# Patient Record
Sex: Female | Born: 1975 | Race: White | Hispanic: No | Marital: Married | Smoking: Never smoker
Health system: Southern US, Community
[De-identification: ages and names within clinical notes are randomized; demographics above are authoritative.]

## PROBLEM LIST (undated history)

## (undated) DIAGNOSIS — R519 Headache, unspecified: Secondary | ICD-10-CM

## (undated) DIAGNOSIS — R51 Headache: Secondary | ICD-10-CM

## (undated) DIAGNOSIS — N979 Female infertility, unspecified: Secondary | ICD-10-CM

## (undated) DIAGNOSIS — Z87442 Personal history of urinary calculi: Secondary | ICD-10-CM

## (undated) DIAGNOSIS — N809 Endometriosis, unspecified: Secondary | ICD-10-CM

## (undated) DIAGNOSIS — J302 Other seasonal allergic rhinitis: Secondary | ICD-10-CM

## (undated) DIAGNOSIS — Z9889 Other specified postprocedural states: Secondary | ICD-10-CM

## (undated) DIAGNOSIS — D649 Anemia, unspecified: Secondary | ICD-10-CM

## (undated) DIAGNOSIS — R112 Nausea with vomiting, unspecified: Secondary | ICD-10-CM

## (undated) HISTORY — PX: WISDOM TOOTH EXTRACTION: SHX21

## (undated) HISTORY — PX: DIAGNOSTIC LAPAROSCOPY: SUR761

## (undated) HISTORY — PX: EYE SURGERY: SHX253

## (undated) HISTORY — PX: LAPAROTOMY: SHX154

---

## 1998-05-10 ENCOUNTER — Encounter: Admission: RE | Admit: 1998-05-10 | Discharge: 1998-08-08 | Payer: Self-pay

## 1999-06-16 ENCOUNTER — Encounter: Payer: Self-pay | Admitting: Emergency Medicine

## 1999-06-16 ENCOUNTER — Emergency Department (HOSPITAL_COMMUNITY): Admission: EM | Admit: 1999-06-16 | Discharge: 1999-06-16 | Payer: Self-pay | Admitting: Emergency Medicine

## 2000-04-16 ENCOUNTER — Ambulatory Visit (HOSPITAL_COMMUNITY): Admission: RE | Admit: 2000-04-16 | Discharge: 2000-04-16 | Payer: Self-pay | Admitting: *Deleted

## 2000-04-16 ENCOUNTER — Encounter: Payer: Self-pay | Admitting: *Deleted

## 2000-10-22 ENCOUNTER — Other Ambulatory Visit: Admission: RE | Admit: 2000-10-22 | Discharge: 2000-10-22 | Payer: Self-pay | Admitting: *Deleted

## 2000-12-16 ENCOUNTER — Observation Stay (HOSPITAL_COMMUNITY): Admission: RE | Admit: 2000-12-16 | Discharge: 2000-12-17 | Payer: Self-pay | Admitting: Obstetrics and Gynecology

## 2000-12-16 ENCOUNTER — Encounter (INDEPENDENT_AMBULATORY_CARE_PROVIDER_SITE_OTHER): Payer: Self-pay

## 2001-10-24 ENCOUNTER — Other Ambulatory Visit: Admission: RE | Admit: 2001-10-24 | Discharge: 2001-10-24 | Payer: Self-pay | Admitting: Obstetrics and Gynecology

## 2002-10-27 ENCOUNTER — Other Ambulatory Visit: Admission: RE | Admit: 2002-10-27 | Discharge: 2002-10-27 | Payer: Self-pay | Admitting: Obstetrics and Gynecology

## 2002-10-29 ENCOUNTER — Ambulatory Visit (HOSPITAL_COMMUNITY): Admission: RE | Admit: 2002-10-29 | Discharge: 2002-10-29 | Payer: Self-pay | Admitting: Obstetrics and Gynecology

## 2002-11-02 ENCOUNTER — Ambulatory Visit (HOSPITAL_BASED_OUTPATIENT_CLINIC_OR_DEPARTMENT_OTHER): Admission: RE | Admit: 2002-11-02 | Discharge: 2002-11-02 | Payer: Self-pay | Admitting: Obstetrics and Gynecology

## 2003-11-17 ENCOUNTER — Other Ambulatory Visit: Admission: RE | Admit: 2003-11-17 | Discharge: 2003-11-17 | Payer: Self-pay | Admitting: Obstetrics and Gynecology

## 2004-06-23 ENCOUNTER — Ambulatory Visit (HOSPITAL_COMMUNITY): Admission: RE | Admit: 2004-06-23 | Discharge: 2004-06-23 | Payer: Self-pay | Admitting: Obstetrics and Gynecology

## 2004-06-23 ENCOUNTER — Encounter (INDEPENDENT_AMBULATORY_CARE_PROVIDER_SITE_OTHER): Payer: Self-pay | Admitting: *Deleted

## 2005-04-18 ENCOUNTER — Ambulatory Visit (HOSPITAL_COMMUNITY): Admission: RE | Admit: 2005-04-18 | Discharge: 2005-04-18 | Payer: Self-pay | Admitting: Family Medicine

## 2005-04-18 ENCOUNTER — Emergency Department (HOSPITAL_COMMUNITY): Admission: EM | Admit: 2005-04-18 | Discharge: 2005-04-18 | Payer: Self-pay | Admitting: Family Medicine

## 2009-01-14 ENCOUNTER — Ambulatory Visit (HOSPITAL_COMMUNITY): Admission: RE | Admit: 2009-01-14 | Discharge: 2009-01-14 | Payer: Self-pay | Admitting: Obstetrics and Gynecology

## 2010-05-16 LAB — CBC
HCT: 37.5 % (ref 36.0–46.0)
Hemoglobin: 12.5 g/dL (ref 12.0–15.0)
MCHC: 33.3 g/dL (ref 30.0–36.0)
MCV: 95.7 fL (ref 78.0–100.0)
Platelets: 183 10*3/uL (ref 150–400)
RBC: 3.92 MIL/uL (ref 3.87–5.11)
RDW: 13.4 % (ref 11.5–15.5)
WBC: 3.9 10*3/uL — ABNORMAL LOW (ref 4.0–10.5)

## 2010-06-30 NOTE — Op Note (Signed)
Heather Sawyer, Heather Sawyer                        ACCOUNT NO.:  [id number]   MEDICAL RECORD NO.:  [id number]                   PATIENT TYPE:  AMB   LOCATION:  [facility]                                 FACILITY:  [facility]   PHYSICIAN:  Cynthia P. Romine, M.D.             DATE OF BIRTH:  March 31, 1975   DATE OF PROCEDURE:  11/02/2002  DATE OF DISCHARGE:                                 OPERATIVE REPORT   PREOPERATIVE DIAGNOSIS:  Primary infertility, rule out endometriosis.   POSTOPERATIVE DIAGNOSIS:  Endometriosis.   PROCEDURE:  Diagnostic laparoscopy, tubal lavage, and laser of endometriotic  implant.   SURGEON:  Cynthia P. Romine, M.D.   ANESTHESIA:  General endotracheal.   ESTIMATED BLOOD LOSS:  Minimal.   COMPLICATIONS:  None.   FINDINGS:  The right ovary was adherent to the distal end of the tube and to  the posterior aspect of the fundus in one small area. The anterior bladder  flap had an area of endometriosis approximately 0.5 cm in diameter on the  patient's right.  In the posterior cul-de-sac there was partial obliteration  and red endometriosis along both uterosacral ligaments.  The right ovary had  an approximately 1 cm chocolate endometriotic cyst and scattered powder burn  implants of endometriosis across the surface.  The tubes were patent to  tubal lavage with indigo carmine.  The appendix was normal.   DESCRIPTION OF PROCEDURE:  The patient was taken to the operating room and  after the induction of adequate general endotracheal anesthesia, was placed  in the dorsal lithotomy position and prepped and draped in the usual  fashion.  The cervix was grasped off the anterior lip with a single-tooth  tenaculum, and a Hulka uterine manipulator was placed.  Attention was next  turned to the abdomen.  A small incision was made vertically beneath the  umbilicus in the space of the umbilicus, and the Veress needle was inserted  into the peritoneal space.  Proper placement was tested  by noting free flow  of saline through the Veress needle with a negative aspirate and then by  noting the response of a drop of saline placed at the hub of the Veress  needle to negative pressure as the abdominal wall was elevated.  Pneumoperitoneum was created with 2.5 liters of CO2 using the automatic  insufflator.  A  disposable 10-11 mm trocar was then inserted into the  peritoneal space and its proper placement noted with the laparoscope.  Next,  a small suprapubic incision was made on the patient's right, and a 5 mm  trocar was inserted under direct visualization.  The pelvis was inspected  with the above findings.  The endometriotic implant on the posterior cul-de-  sac and on the left ovary were vaporized using the YAG laser at a power  setting of 15 watts.  Tubal lavage was then carried out with indigo carmine,  and free flow was  noted bilaterally.  The appendix was visualized and felt  to be normal.  There were some filmy adhesions of the large intestine to the  pelvic sidewall at the pelvic brim on the patient's right.  These did not  seem to interfere with the mobility of the tube or the ovary and were left  in place.  The procedure was then terminated.  The instruments were removed  from the abdomen.  The pneumoperitoneum was  allowed to escape.  The trocar sleeves were removed.  The incisions were  closed subcuticularly with 3-0 Vicryl Rapide.  The incisions were injected  with 0.5% Marcaine with epinephrine for postop pain relief.  Steri-Strips  and dressing were applied.  The instruments were removed from the vagina,  and the procedure was terminated.                                               Cynthia P. Romine, M.D.    CPR/MEDQ  D:  11/02/2002  T:  11/02/2002  Job:  [id number]

## 2010-06-30 NOTE — Op Note (Signed)
[facility]  Patient:    Heather Sawyer, Heather Sawyer Visit Number: [id number] MRN: [id number]          Service Type: GYN Location: 910A 9105 [id number] Attending Physician:  Brynda Peon Dictated by:   Edwena Felty. Ashley Royalty, M.D. Proc. Date: 12/16/00 Admit Date:  12/16/2000                             Operative Report  PREOPERATIVE DIAGNOSIS:       A 4 cm right ovarian dermoid cyst.  POSTOPERATIVE DIAGNOSIS:      A 4 cm right ovarian dermoid cyst, pathology pending.  PROCEDURE:                    Minilaparotomy with ovarian cystectomy on the right.  SURGEON:                      Cynthia P. Ashley Royalty, M.D.  ASSISTANT:                    Andres Ege, M.D.  ANESTHESIA:                   General endotracheal.  ESTIMATED BLOOD LOSS:         Minimal.  COMPLICATIONS:                None.  DESCRIPTION OF PROCEDURE:     The patient was taken to the operating room and, after the induction of adequate general endotracheal anesthesia, she was placed in the frog-leg position.  She was prepped in the usual fashion and an acorn uterine manipulator was placed.  The patient was then draped.  A small suprapubic incision was made approximately 6 cm over the midline.  The incision was carried down to the fascia using the Bovie.  The fascia was nicked and opened transversely with Mayo scissors.  Kochers were used to grasp the fascial margins.  It was separated from the underlying rectus muscles with sharp dissection.  The rectus muscles were separated bluntly in the midline. The underlying peritoneum was elevated between hemostats and entered atraumatically.  The peritoneum was opened vertically.  The uterine manipulator was used to elevate the uterus.  The right ovary was visualized. It was somewhat adherent to the posterior leaf of the broad ligament, but this adhesions could be taken down bluntly with the surgeons finger.  The ovary was elevated out of the  incision and surrounded with a wet lap.  An incision was made over the epithelium of the ovary and the ovarian tissue was dissected off the cyst using Metzenbaum scissors.  The cyst was removed intact.  It was opened after it was sent off to the table.  It did contain sebum and hair. The ovarian stroma was reapproximated with pursestring sutures of 2-0 chromic and the ovarian epithelium was closed with a baseball stitch using 4-0 Vicryl. Excellent hemostasis was noted.  The ovary was allowed to return to its anatomic position.  INTERCEED was placed over the ovarian incision.  The uterus otherwise appeared normal, as did the tube on the right side and tube and the ovary on the left side.  The peritoneum was then closed in a running fashion using 0 chromic.  The fascia was closed in a running fashion going from each end to the midline using 0 Vicryl.  Hemostasis was achieved in the subcu tissue  with the Bovie.  The skin was closed with 3-0 Vicryl Rapide.  The incision was infiltrated with 10 cc of 0.5% Marcaine with epinephrine for postoperative pain relief.  Benzoin and Steri-Strips was placed on the skin and a dressing was applied.  The acorn manipulator was removed from the vagina and the procedure was terminated.  The patient tolerated it well and went in satisfactory condition to postanesthesia recovery. Dictated by:   Edwena Felty. Ashley Royalty, M.D. Attending Physician:  Brynda Peon DD:  12/16/00 TD:  12/17/00 Job: [id number]

## 2010-06-30 NOTE — Op Note (Signed)
Heather Sawyer, Heather Sawyer              ACCOUNT NO.:  [id number]   MEDICAL RECORD NO.:  [id number]          PATIENT TYPE:  AMB   LOCATION:  [facility]                           FACILITY:  [facility]   PHYSICIAN:  Lenoard Aden, M.D.DATE OF BIRTH:  [birth year]   DATE OF PROCEDURE:  06/23/2004  DATE OF DISCHARGE:                                 OPERATIVE REPORT   PREOPERATIVE DIAGNOSIS:  Missed abortion for twin monoamniotic gestation at  10 weeks.   POSTOPERATIVE DIAGNOSIS:  Missed abortion for twin monoamniotic gestation at  10 weeks.   PROCEDURE:  Suction dilatation and evacuation under ultrasound guidance.   SURGEON:  Lenoard Aden, M.D.   ANESTHESIA:  MAC and paracervical.   ESTIMATED BLOOD LOSS:  Less than 50 mL.   COMPLICATIONS:  None.   DRAINS:  None.   COUNTS:  Correct.   Patient to recovery in good condition.   BRIEF OPERATIVE NOTE:  The patient was brought to the operating room after  being apprised of the risks and benefits of surgery, risks of anesthesia,  infection, bleeding, injury to abdominal organs and need for repair, delayed  versus immediate complications to include bowel and bladder injury are  noted.  At this time the patient was brought to the operating room.  Feet  are placed in the yellowfin stirrups in dorsal lithotomy position.  Exam  under anesthesia reveals a 10-week anteflexed uterus, no adnexal masses.  At  this time paracervical block with a 2% Xylocaine solution is placed without  difficulty.  The patient is prepped and draped in the usual sterile fashion,  catheterized until the bladder is empty.  The cervix is dilated with  difficulty due to acute anteflexion and sounds to 11 cm.  It is dilated with  difficulty up to a #21 Pratt dilator.  At this time suction curette reveals  minimal tissue and the thought is that due to the acute angulation of the  cavity there is inability to enter the cavity.  Therefore, ultrasound  guidance is requested.  The  cavity is then entered with acute angulation of  the uterine sound and easily entered.  A 7 mm suction curette is placed and  suction reveals products of conception in the tubing.  Repeat blunt  curettage in a four-quadrant  method, again suction reveals the cavity to be empty and is also confirmed  by ultrasound visualization of an otherwise normal uterine cavity.  Good  hemostasis is noted and minimal blood loss is appreciated.  The patient  tolerates the procedure well and is transferred to recovery in good  condition.      RJT/MEDQ  D:  06/23/2004  T:  06/23/2004  Job:  [id number]

## 2010-08-25 ENCOUNTER — Ambulatory Visit: Payer: Self-pay | Admitting: Internal Medicine

## 2012-11-27 ENCOUNTER — Emergency Department (INDEPENDENT_AMBULATORY_CARE_PROVIDER_SITE_OTHER)
Admission: EM | Admit: 2012-11-27 | Discharge: 2012-11-27 | Disposition: A | Discharge status: Home / Self Care | Payer: Self-pay | Source: Home / Self Care | Attending: Family Medicine | Admitting: Family Medicine

## 2012-11-27 ENCOUNTER — Encounter (HOSPITAL_COMMUNITY): Payer: Self-pay | Admitting: Emergency Medicine

## 2012-11-27 DIAGNOSIS — S139XXA Sprain of joints and ligaments of unspecified parts of neck, initial encounter: Secondary | ICD-10-CM

## 2012-11-27 DIAGNOSIS — S134XXA Sprain of ligaments of cervical spine, initial encounter: Secondary | ICD-10-CM

## 2012-11-27 MED ORDER — CYCLOBENZAPRINE HCL 10 MG PO TABS: 10.0000 mg | ORAL_TABLET | Freq: Two times a day (BID) | ORAL | Status: DC | PRN

## 2012-11-27 MED ORDER — TRAMADOL HCL 50 MG PO TABS: 50.0000 mg | ORAL_TABLET | Freq: Four times a day (QID) | ORAL | Status: DC | PRN
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
## 2012-12-02 NOTE — ED Provider Notes (Signed)
Medical screening examination/treatment/procedure(s) were performed by resident physician or non-physician practitioner and as supervising physician I was immediately available for consultation/collaboration.   KINDL,JAMES DOUGLAS MD.   James D Kindl, MD 12/02/12 2141 

## 2015-03-17 DIAGNOSIS — Z1151 Encounter for screening for human papillomavirus (HPV): Secondary | ICD-10-CM | POA: Diagnosis not present

## 2015-03-17 DIAGNOSIS — Z01419 Encounter for gynecological examination (general) (routine) without abnormal findings: Secondary | ICD-10-CM | POA: Diagnosis not present

## 2015-03-17 DIAGNOSIS — Z1231 Encounter for screening mammogram for malignant neoplasm of breast: Secondary | ICD-10-CM | POA: Diagnosis not present

## 2015-03-18 MED FILL — IBUPROFEN 800 MG TABLET: 800 | 20 days supply | Qty: 60 | Fill #0

## 2015-03-21 MED FILL — LIDOCAINE-HC 3-0.5% CRM KIT: 3-0.5 | 7 days supply | Qty: 98 | Fill #0

## 2015-04-07 DIAGNOSIS — E7800 Pure hypercholesterolemia, unspecified: Secondary | ICD-10-CM | POA: Diagnosis not present

## 2015-04-07 DIAGNOSIS — Z13220 Encounter for screening for lipoid disorders: Secondary | ICD-10-CM | POA: Diagnosis not present

## 2015-07-14 DIAGNOSIS — K648 Other hemorrhoids: Secondary | ICD-10-CM | POA: Diagnosis not present

## 2015-07-14 DIAGNOSIS — K644 Residual hemorrhoidal skin tags: Secondary | ICD-10-CM | POA: Diagnosis not present

## 2015-07-14 MED FILL — HYDROCORT-PRAMOXINE 2.5-1%: 2.5-1 | 10 days supply | Qty: 30 | Fill #0

## 2015-09-06 MED FILL — IBUPROFEN 800 MG TABLET: 800 | 20 days supply | Qty: 60 | Fill #1

## 2015-12-20 DIAGNOSIS — H5203 Hypermetropia, bilateral: Secondary | ICD-10-CM | POA: Diagnosis not present

## 2015-12-20 DIAGNOSIS — H524 Presbyopia: Secondary | ICD-10-CM | POA: Diagnosis not present

## 2016-02-13 MED FILL — IBUPROFEN 800 MG TABLET: 800 | 20 days supply | Qty: 60 | Fill #2

## 2016-04-11 DIAGNOSIS — Z1231 Encounter for screening mammogram for malignant neoplasm of breast: Secondary | ICD-10-CM | POA: Diagnosis not present

## 2016-04-11 DIAGNOSIS — Z6824 Body mass index (BMI) 24.0-24.9, adult: Secondary | ICD-10-CM | POA: Diagnosis not present

## 2016-04-11 DIAGNOSIS — Z01419 Encounter for gynecological examination (general) (routine) without abnormal findings: Secondary | ICD-10-CM | POA: Diagnosis not present

## 2016-04-17 MED FILL — CELECOXIB 200 MG CAP: 200 | 15 days supply | Qty: 30 | Fill #0

## 2016-06-08 DIAGNOSIS — N938 Other specified abnormal uterine and vaginal bleeding: Secondary | ICD-10-CM | POA: Diagnosis not present

## 2016-06-08 DIAGNOSIS — R1909 Other intra-abdominal and pelvic swelling, mass and lump: Secondary | ICD-10-CM | POA: Diagnosis not present

## 2016-06-08 DIAGNOSIS — R1031 Right lower quadrant pain: Secondary | ICD-10-CM | POA: Diagnosis not present

## 2016-09-03 ENCOUNTER — Other Ambulatory Visit: Payer: Self-pay | Admitting: Obstetrics and Gynecology

## 2016-09-03 NOTE — Patient Instructions (Addendum)
Your procedure is scheduled on:  Monday, August 6  Enter through the [employer] of [facility] at:  11 AM  Pick up the phone at the desk and dial [phone].  Call this number if you have problems the morning of surgery: [phone].  Remember: Do NOT eat food or drink clear liquids (including water) after Midnight Sunday  Take these medicines the morning of surgery with a SIP OF WATER:  zyrtec if needed  Stop herbal medications and supplements at this time.  Do NOT wear jewelry (body piercing), metal hair clips/bobby pins, make-up, or nail polish. Do NOT wear lotions, powders, or perfumes.  You may wear deoderant. Do NOT shave for 48 hours prior to surgery. Do NOT bring valuables to the hospital. Contacts may not be worn into surgery.  Leave suitcase in car.  After surgery it may be brought to your room.  For patients admitted to the hospital, checkout time is 11:00 AM the day of discharge. Have a responsible adult drive you home and stay with you for 24 hours after your procedure.  Home with husband Heather Sawyer cell [phone].

## 2016-09-05 ENCOUNTER — Encounter (HOSPITAL_COMMUNITY)
Admission: RE | Admit: 2016-09-05 | Discharge: 2016-09-05 | Disposition: A | Discharge status: Home / Self Care | Payer: 59 | Source: Ambulatory Visit | Attending: Obstetrics and Gynecology | Admitting: Obstetrics and Gynecology

## 2016-09-05 ENCOUNTER — Encounter (HOSPITAL_COMMUNITY): Payer: Self-pay

## 2016-09-05 DIAGNOSIS — Z01818 Encounter for other preprocedural examination: Secondary | ICD-10-CM | POA: Diagnosis not present

## 2016-09-05 HISTORY — DX: Headache: R51

## 2016-09-05 HISTORY — DX: Nausea with vomiting, unspecified: R11.2

## 2016-09-05 HISTORY — DX: Personal history of urinary calculi: Z87.442

## 2016-09-05 HISTORY — DX: Endometriosis, unspecified: N80.9

## 2016-09-05 HISTORY — DX: Female infertility, unspecified: N97.9

## 2016-09-05 HISTORY — DX: Anemia, unspecified: D64.9

## 2016-09-05 HISTORY — DX: Headache, unspecified: R51.9

## 2016-09-05 HISTORY — DX: Other seasonal allergic rhinitis: J30.2

## 2016-09-05 HISTORY — DX: Other specified postprocedural states: Z98.890

## 2016-09-05 LAB — BASIC METABOLIC PANEL
ANION GAP: 5 (ref 5–15)
BUN: 15 mg/dL (ref 6–20)
CALCIUM: 9.5 mg/dL (ref 8.9–10.3)
CO2: 26 mmol/L (ref 22–32)
Chloride: 104 mmol/L (ref 101–111)
Creatinine, Ser: 0.91 mg/dL (ref 0.44–1.00)
GFR calc Af Amer: >60 mL/min (ref >60)
GLUCOSE: 86 mg/dL (ref 65–99)
Potassium: 4.4 mmol/L (ref 3.5–5.1)
Sodium: 135 mmol/L (ref 135–145)

## 2016-09-05 LAB — CBC
HCT: 37.6 % (ref 36.0–46.0)
HEMOGLOBIN: 12.5 g/dL (ref 12.0–15.0)
MCH: 30.7 pg (ref 26.0–34.0)
MCHC: 33.2 g/dL (ref 30.0–36.0)
MCV: 92.4 fL (ref 78.0–100.0)
Platelets: 209 10*3/uL (ref 150–400)
RBC: 4.07 MIL/uL (ref 3.87–5.11)
RDW: 14.2 % (ref 11.5–15.5)
WBC: 5.8 10*3/uL (ref 4.0–10.5)

## 2016-09-05 LAB — TYPE AND SCREEN
ABO/RH(D): O POS
Antibody Screen: NEGATIVE

## 2016-09-05 LAB — ABO/RH: ABO/RH(D): O POS

## 2016-09-06 ENCOUNTER — Inpatient Hospital Stay (HOSPITAL_COMMUNITY): Admission: RE | Admit: 2016-09-06 | Payer: 59 | Source: Ambulatory Visit

## 2016-09-17 ENCOUNTER — Encounter (HOSPITAL_COMMUNITY): Payer: Self-pay | Admitting: *Deleted

## 2016-09-17 ENCOUNTER — Encounter (HOSPITAL_COMMUNITY)
Admission: RE | Disposition: A | Discharge status: Home / Self Care | Payer: Self-pay | Source: Ambulatory Visit | Attending: Obstetrics and Gynecology

## 2016-09-17 ENCOUNTER — Ambulatory Visit (HOSPITAL_COMMUNITY): Payer: 59 | Admitting: Anesthesiology

## 2016-09-17 ENCOUNTER — Ambulatory Visit (HOSPITAL_COMMUNITY)
Admission: RE | Admit: 2016-09-17 | Discharge: 2016-09-18 | Disposition: A | Discharge status: Home / Self Care | Payer: 59 | Source: Ambulatory Visit | Attending: Obstetrics and Gynecology | Admitting: Obstetrics and Gynecology

## 2016-09-17 DIAGNOSIS — N946 Dysmenorrhea, unspecified: Secondary | ICD-10-CM | POA: Insufficient documentation

## 2016-09-17 DIAGNOSIS — N920 Excessive and frequent menstruation with regular cycle: Secondary | ICD-10-CM | POA: Diagnosis not present

## 2016-09-17 DIAGNOSIS — N840 Polyp of corpus uteri: Secondary | ICD-10-CM | POA: Insufficient documentation

## 2016-09-17 DIAGNOSIS — N838 Other noninflammatory disorders of ovary, fallopian tube and broad ligament: Secondary | ICD-10-CM | POA: Diagnosis not present

## 2016-09-17 DIAGNOSIS — N802 Endometriosis of fallopian tube: Secondary | ICD-10-CM | POA: Insufficient documentation

## 2016-09-17 DIAGNOSIS — N8311 Corpus luteum cyst of right ovary: Secondary | ICD-10-CM | POA: Insufficient documentation

## 2016-09-17 DIAGNOSIS — N736 Female pelvic peritoneal adhesions (postinfective): Secondary | ICD-10-CM | POA: Diagnosis not present

## 2016-09-17 DIAGNOSIS — N803 Endometriosis of pelvic peritoneum: Secondary | ICD-10-CM | POA: Insufficient documentation

## 2016-09-17 DIAGNOSIS — D259 Leiomyoma of uterus, unspecified: Secondary | ICD-10-CM | POA: Insufficient documentation

## 2016-09-17 DIAGNOSIS — R102 Pelvic and perineal pain: Secondary | ICD-10-CM | POA: Diagnosis not present

## 2016-09-17 DIAGNOSIS — N809 Endometriosis, unspecified: Secondary | ICD-10-CM | POA: Diagnosis present

## 2016-09-17 HISTORY — PX: ROBOTIC ASSISTED TOTAL HYSTERECTOMY WITH SALPINGECTOMY: SHX6679

## 2016-09-17 SURGERY — ROBOTIC ASSISTED TOTAL HYSTERECTOMY WITH SALPINGECTOMY
Anesthesia: General | Laterality: Bilateral

## 2016-09-17 MED ORDER — OXYCODONE-ACETAMINOPHEN 5-325 MG PO TABS: 1.0000 | ORAL_TABLET | ORAL | Status: DC | PRN

## 2016-09-17 MED ORDER — OXYCODONE HCL 5 MG PO TABS: 5.0000 mg | ORAL_TABLET | Freq: Once | ORAL | Status: DC | PRN

## 2016-09-17 MED ORDER — ONDANSETRON HCL 4 MG/2ML IJ SOLN
4.0000 mg | Freq: Once | INTRAMUSCULAR | Status: AC
  Administered 2016-09-17: 4 mg via INTRAVENOUS

## 2016-09-17 MED ORDER — LIDOCAINE HCL (CARDIAC) 20 MG/ML IV SOLN
INTRAVENOUS | Status: AC
  Filled 2016-09-17: qty 5

## 2016-09-17 MED ORDER — HYDROMORPHONE HCL 1 MG/ML IJ SOLN
0.2500 mg | INTRAMUSCULAR | Status: DC | PRN
  Administered 2016-09-17 (×2): 0.25 mg via INTRAVENOUS

## 2016-09-17 MED ORDER — FENTANYL CITRATE (PF) 100 MCG/2ML IJ SOLN
INTRAMUSCULAR | Status: AC
  Administered 2016-09-17: 50 ug via INTRAVENOUS
  Filled 2016-09-17: qty 2

## 2016-09-17 MED ORDER — LACTATED RINGERS IV SOLN
INTRAVENOUS | Status: DC
  Administered 2016-09-17 (×3): via INTRAVENOUS

## 2016-09-17 MED ORDER — FENTANYL CITRATE (PF) 100 MCG/2ML IJ SOLN
50.0000 ug | Freq: Once | INTRAMUSCULAR | Status: AC
  Administered 2016-09-17: 50 ug via INTRAVENOUS

## 2016-09-17 MED ORDER — ROPIVACAINE HCL 5 MG/ML IJ SOLN
INTRAMUSCULAR | Status: AC
  Filled 2016-09-17: qty 60

## 2016-09-17 MED ORDER — FENTANYL CITRATE (PF) 250 MCG/5ML IJ SOLN
INTRAMUSCULAR | Status: AC
  Filled 2016-09-17: qty 5

## 2016-09-17 MED ORDER — DEXAMETHASONE SODIUM PHOSPHATE 4 MG/ML IJ SOLN
INTRAMUSCULAR | Status: AC
Start: 2016-09-17 — End: ?
  Filled 2016-09-17: qty 1

## 2016-09-17 MED ORDER — LIDOCAINE HCL (CARDIAC) 20 MG/ML IV SOLN
INTRAVENOUS | Status: DC | PRN
  Administered 2016-09-17: 60 mg via INTRAVENOUS

## 2016-09-17 MED ORDER — SODIUM CHLORIDE 0.9 % IJ SOLN
INTRAMUSCULAR | Status: AC
  Filled 2016-09-17: qty 50

## 2016-09-17 MED ORDER — FENTANYL CITRATE (PF) 100 MCG/2ML IJ SOLN
INTRAMUSCULAR | Status: DC | PRN
  Administered 2016-09-17: 100 ug via INTRAVENOUS
  Administered 2016-09-17: 150 ug via INTRAVENOUS

## 2016-09-17 MED ORDER — KETOROLAC TROMETHAMINE 30 MG/ML IJ SOLN
INTRAMUSCULAR | Status: AC
  Filled 2016-09-17: qty 1

## 2016-09-17 MED ORDER — ONDANSETRON HCL 4 MG/2ML IJ SOLN
INTRAMUSCULAR | Status: AC
  Administered 2016-09-17: 4 mg via INTRAVENOUS
  Filled 2016-09-17: qty 2

## 2016-09-17 MED ORDER — TRAMADOL HCL 50 MG PO TABS: 50.0000 mg | ORAL_TABLET | Freq: Four times a day (QID) | ORAL | Status: DC | PRN

## 2016-09-17 MED ORDER — HYDROMORPHONE HCL 1 MG/ML IJ SOLN
INTRAMUSCULAR | Status: AC
  Administered 2016-09-17: 0.25 mg via INTRAVENOUS
  Filled 2016-09-17: qty 0.5

## 2016-09-17 MED ORDER — HYDROMORPHONE HCL 1 MG/ML IJ SOLN
INTRAMUSCULAR | Status: DC | PRN
  Administered 2016-09-17: 1 mg via INTRAVENOUS

## 2016-09-17 MED ORDER — ONDANSETRON HCL 4 MG/2ML IJ SOLN
INTRAMUSCULAR | Status: AC
  Filled 2016-09-17: qty 2

## 2016-09-17 MED ORDER — SODIUM CHLORIDE 0.9% FLUSH: 9.0000 mL | INTRAVENOUS | Status: DC | PRN

## 2016-09-17 MED ORDER — KETOROLAC TROMETHAMINE 30 MG/ML IJ SOLN
INTRAMUSCULAR | Status: DC | PRN
  Administered 2016-09-17: 30 mg via INTRAVENOUS

## 2016-09-17 MED ORDER — SUGAMMADEX SODIUM 200 MG/2ML IV SOLN
INTRAVENOUS | Status: DC | PRN
  Administered 2016-09-17: 200 mg via INTRAVENOUS

## 2016-09-17 MED ORDER — ONDANSETRON HCL 4 MG/2ML IJ SOLN
4.0000 mg | Freq: Four times a day (QID) | INTRAMUSCULAR | Status: DC | PRN
Start: 2016-09-17 — End: 2016-09-18
  Administered 2016-09-18: 4 mg via INTRAVENOUS
  Filled 2016-09-17: qty 2

## 2016-09-17 MED ORDER — BUPIVACAINE HCL (PF) 0.25 % IJ SOLN
INTRAMUSCULAR | Status: AC
  Filled 2016-09-17: qty 30

## 2016-09-17 MED ORDER — MIDAZOLAM HCL 2 MG/2ML IJ SOLN
INTRAMUSCULAR | Status: DC | PRN
  Administered 2016-09-17: 2 mg via INTRAVENOUS

## 2016-09-17 MED ORDER — HYDROMORPHONE 1 MG/ML IV SOLN
INTRAVENOUS | Status: DC
  Administered 2016-09-17: 0.3 mg via INTRAVENOUS
  Administered 2016-09-17: 20:00:00 via INTRAVENOUS
  Administered 2016-09-18: 0.4 mg via INTRAVENOUS
  Administered 2016-09-18: 0.2 mg via INTRAVENOUS
  Filled 2016-09-17: qty 25

## 2016-09-17 MED ORDER — OXYCODONE HCL 5 MG/5ML PO SOLN: 5.0000 mg | Freq: Once | ORAL | Status: DC | PRN

## 2016-09-17 MED ORDER — DIPHENHYDRAMINE HCL 50 MG/ML IJ SOLN: 12.5000 mg | Freq: Four times a day (QID) | INTRAMUSCULAR | Status: DC | PRN

## 2016-09-17 MED ORDER — SCOPOLAMINE 1 MG/3DAYS TD PT72
MEDICATED_PATCH | TRANSDERMAL | Status: AC
  Administered 2016-09-17: 1.5 mg via TRANSDERMAL
  Filled 2016-09-17: qty 1

## 2016-09-17 MED ORDER — DEXTROSE IN LACTATED RINGERS 5 % IV SOLN
INTRAVENOUS | Status: DC
  Administered 2016-09-17 – 2016-09-18 (×2): via INTRAVENOUS

## 2016-09-17 MED ORDER — ONDANSETRON HCL 4 MG/2ML IJ SOLN
INTRAMUSCULAR | Status: DC | PRN
  Administered 2016-09-17: 4 mg via INTRAVENOUS

## 2016-09-17 MED ORDER — MEPERIDINE HCL 25 MG/ML IJ SOLN: 6.2500 mg | INTRAMUSCULAR | Status: DC | PRN

## 2016-09-17 MED ORDER — SODIUM CHLORIDE 0.9 % IJ SOLN
INTRAMUSCULAR | Status: DC | PRN
  Administered 2016-09-17: 10 mL

## 2016-09-17 MED ORDER — SCOPOLAMINE 1 MG/3DAYS TD PT72
1.0000 | MEDICATED_PATCH | Freq: Once | TRANSDERMAL | Status: DC
  Administered 2016-09-17: 1.5 mg via TRANSDERMAL

## 2016-09-17 MED ORDER — PROPOFOL 10 MG/ML IV BOLUS
INTRAVENOUS | Status: DC | PRN
  Administered 2016-09-17: 140 mg via INTRAVENOUS

## 2016-09-17 MED ORDER — PROMETHAZINE HCL 25 MG/ML IJ SOLN: 6.2500 mg | INTRAMUSCULAR | Status: DC | PRN

## 2016-09-17 MED ORDER — ROCURONIUM BROMIDE 100 MG/10ML IV SOLN
INTRAVENOUS | Status: AC
  Filled 2016-09-17: qty 1

## 2016-09-17 MED ORDER — BUPIVACAINE HCL (PF) 0.25 % IJ SOLN
INTRAMUSCULAR | Status: DC | PRN
  Administered 2016-09-17: 30 mL

## 2016-09-17 MED ORDER — NALOXONE HCL 0.4 MG/ML IJ SOLN: 0.4000 mg | INTRAMUSCULAR | Status: DC | PRN

## 2016-09-17 MED ORDER — MIDAZOLAM HCL 2 MG/2ML IJ SOLN
INTRAMUSCULAR | Status: AC
  Filled 2016-09-17: qty 2

## 2016-09-17 MED ORDER — PROPOFOL 10 MG/ML IV BOLUS
INTRAVENOUS | Status: AC
  Filled 2016-09-17: qty 20

## 2016-09-17 MED ORDER — CEFAZOLIN SODIUM-DEXTROSE 2-4 GM/100ML-% IV SOLN
2.0000 g | INTRAVENOUS | Status: AC
  Administered 2016-09-17: 2 g via INTRAVENOUS

## 2016-09-17 MED ORDER — DIPHENHYDRAMINE HCL 12.5 MG/5ML PO ELIX: 12.5000 mg | ORAL_SOLUTION | Freq: Four times a day (QID) | ORAL | Status: DC | PRN

## 2016-09-17 MED ORDER — ROCURONIUM BROMIDE 100 MG/10ML IV SOLN
INTRAVENOUS | Status: DC | PRN
  Administered 2016-09-17: 50 mg via INTRAVENOUS
  Administered 2016-09-17: 20 mg via INTRAVENOUS
  Administered 2016-09-17: 10 mg via INTRAVENOUS

## 2016-09-17 MED ORDER — HYDROMORPHONE HCL 1 MG/ML IJ SOLN
INTRAMUSCULAR | Status: AC
  Filled 2016-09-17: qty 1

## 2016-09-17 SURGICAL SUPPLY — 55 items
ADH SKN CLS APL DERMABOND .7 (GAUZE/BANDAGES/DRESSINGS) ×1
BARRIER ADHS 3X4 INTERCEED (GAUZE/BANDAGES/DRESSINGS) IMPLANT
BRR ADH 4X3 ABS CNTRL BYND (GAUZE/BANDAGES/DRESSINGS)
CATH FOLEY 3WAY  5CC 16FR (CATHETERS) ×1
CATH FOLEY 3WAY 5CC 16FR (CATHETERS) ×1 IMPLANT
CLOTH BEACON ORANGE TIMEOUT ST (SAFETY) ×2 IMPLANT
CONT PATH 16OZ SNAP LID 3702 (MISCELLANEOUS) ×2 IMPLANT
COVER BACK TABLE 60X90IN (DRAPES) ×4 IMPLANT
COVER TIP SHEARS 8 DVNC (MISCELLANEOUS) ×1 IMPLANT
COVER TIP SHEARS 8MM DA VINCI (MISCELLANEOUS) ×1
DECANTER SPIKE VIAL GLASS SM (MISCELLANEOUS) ×6 IMPLANT
DERMABOND ADVANCED (GAUZE/BANDAGES/DRESSINGS) ×1
DERMABOND ADVANCED .7 DNX12 (GAUZE/BANDAGES/DRESSINGS) ×1 IMPLANT
DURAPREP 26ML APPLICATOR (WOUND CARE) ×2 IMPLANT
ELECT REM PT RETURN 9FT ADLT (ELECTROSURGICAL) ×2
ELECTRODE REM PT RTRN 9FT ADLT (ELECTROSURGICAL) ×1 IMPLANT
GAUZE VASELINE 3X9 (GAUZE/BANDAGES/DRESSINGS) IMPLANT
GLOVE BIO SURGEON STRL SZ7.5 (GLOVE) ×6 IMPLANT
GLOVE BIOGEL PI IND STRL 7.0 (GLOVE) ×2 IMPLANT
GLOVE BIOGEL PI INDICATOR 7.0 (GLOVE) ×2
KIT ACCESSORY DA VINCI DISP (KITS) ×1
KIT ACCESSORY DVNC DISP (KITS) ×1 IMPLANT
LEGGING LITHOTOMY PAIR STRL (DRAPES) ×2 IMPLANT
NEEDLE INSUFFLATION 150MM (ENDOMECHANICALS) ×2 IMPLANT
OCCLUDER COLPOPNEUMO (BALLOONS) ×2 IMPLANT
PACK ROBOT WH (CUSTOM PROCEDURE TRAY) ×2 IMPLANT
PACK ROBOTIC GOWN (GOWN DISPOSABLE) ×2 IMPLANT
PACK TRENDGUARD 450 HYBRID PRO (MISCELLANEOUS) IMPLANT
PACK TRENDGUARD 600 HYBRD PROC (MISCELLANEOUS) IMPLANT
PAD PREP 24X48 CUFFED NSTRL (MISCELLANEOUS) ×2 IMPLANT
POUCH LAPAROSCOPIC INSTRUMENT (MISCELLANEOUS) IMPLANT
PROTECTOR NERVE ULNAR (MISCELLANEOUS) ×4 IMPLANT
SET CYSTO W/LG BORE CLAMP LF (SET/KITS/TRAYS/PACK) IMPLANT
SET IRRIG TUBING LAPAROSCOPIC (IRRIGATION / IRRIGATOR) ×2 IMPLANT
SET TRI-LUMEN FLTR TB AIRSEAL (TUBING) ×2 IMPLANT
SUT VIC AB 0 CT1 27 (SUTURE) ×4
SUT VIC AB 0 CT1 27XBRD ANBCTR (SUTURE) ×2 IMPLANT
SUT VICRYL 0 UR6 27IN ABS (SUTURE) ×2 IMPLANT
SUT VICRYL RAPIDE 4/0 PS 2 (SUTURE) ×4 IMPLANT
SUT VLOC 180 0 9IN  GS21 (SUTURE)
SUT VLOC 180 0 9IN GS21 (SUTURE) IMPLANT
SYR 50ML LL SCALE MARK (SYRINGE) ×2 IMPLANT
SYRINGE 10CC LL (SYRINGE) ×4 IMPLANT
TIP RUMI ORANGE 6.7MMX12CM (TIP) IMPLANT
TIP UTERINE 5.1X6CM LAV DISP (MISCELLANEOUS) IMPLANT
TIP UTERINE 6.7X10CM GRN DISP (MISCELLANEOUS) IMPLANT
TIP UTERINE 6.7X6CM WHT DISP (MISCELLANEOUS) IMPLANT
TIP UTERINE 6.7X8CM BLUE DISP (MISCELLANEOUS) ×1 IMPLANT
TOWEL OR 17X24 6PK STRL BLUE (TOWEL DISPOSABLE) ×6 IMPLANT
TRENDGUARD 450 HYBRID PRO PACK (MISCELLANEOUS) ×2
TRENDGUARD 600 HYBRID PROC PK (MISCELLANEOUS)
TROCAR DISP BLADELESS 8 DVNC (TROCAR) ×1 IMPLANT
TROCAR DISP BLADELESS 8MM (TROCAR) ×1
TROCAR PORT AIRSEAL 5X120 (TROCAR) ×2 IMPLANT
TROCAR Z-THREAD 12X150 (TROCAR) ×2 IMPLANT

## 2016-09-17 NOTE — Progress Notes (Signed)
Patient ID: Heather Sawyer, female   DOB: 1975-07-09, 41 y.o.   MRN: [id number] Patient seen and examined. Consent witnessed and signed. No changes noted. Update completed.

## 2016-09-17 NOTE — Anesthesia Procedure Notes (Signed)
Procedure Name: Intubation Date/Time: 09/17/2016 1:45 PM Performed by: Jonna Munro Pre-anesthesia Checklist: Patient identified, Patient being monitored, Timeout performed, Emergency Drugs available and Suction available Patient Re-evaluated:Patient Re-evaluated prior to induction Oxygen Delivery Method: Circle System Utilized Preoxygenation: Pre-oxygenation with 100% oxygen Induction Type: IV induction Ventilation: Mask ventilation without difficulty Laryngoscope Size: Mac and 3 Grade View: Grade II Tube type: Oral Tube size: 7.0 mm Number of attempts: 1 Airway Equipment and Method: stylet and Stylet Placement Confirmation: ETT inserted through vocal cords under direct vision,  positive ETCO2 and breath sounds checked- equal and bilateral Secured at: 22 cm Tube secured with: Tape Dental Injury: Teeth and Oropharynx as per pre-operative assessment

## 2016-09-17 NOTE — Op Note (Signed)
09/17/2016  4:05 PM  PATIENT:  Heather Sawyer  41 y.o. female  PRE-OPERATIVE DIAGNOSIS:  Dysmenorrhea  POST-OPERATIVE DIAGNOSIS:  Dysmenorrhea  PROCEDURE:  Procedure(s): ROBOTIC ASSISTED TOTAL HYSTERECTOMY  BILATERAL SALPINGECTOMY, RIGHT OOPHERECTOMY LYSIS OF BOWEL ADHESIONS AND ADNEXAL ADHESIONS MCCALL CUL DE PLASTY EXCISION OF ENDOMETRIOSIS FROM RIGHT CUL DE [place] ABLATION OF ENDOMETRIOSIS  SURGEON:  Surgeon(s): Brien Few, MD Murrell Redden Earlyne Iba, MD  ASSISTANTS: Murrell Redden, MD   ANESTHESIA:   local and general  ESTIMATED BLOOD LOSS: * No blood loss amount entered *   DRAINS: Urinary Catheter (Foley)   LOCAL MEDICATIONS USED:  MARCAINE    and Amount: 30 ml  DISPOSITION OF SPECIMEN: PATHOLOGY   Source of Specimen:  UTERUS , CERVIX , PERITONEUM  COUNTS:  YES  DICTATION #: [id number]  PLAN OF CARE: 23 HR EXTENDED STAY  PATIENT DISPOSITION:  PACU - hemodynamically stable.

## 2016-09-17 NOTE — Anesthesia Postprocedure Evaluation (Signed)
Anesthesia Post Note  Patient: Cumi G Addison  Procedure(s) Performed: Procedure(s) (LRB): ROBOTIC ASSISTED TOTAL HYSTERECTOMY WITH BILATERAL SALPINGECTOMY, RIGHT OOPHERECTOMY (Bilateral)     Patient location during evaluation: PACU Anesthesia Type: General Level of consciousness: awake and alert Pain management: pain level controlled Vital Signs Assessment: post-procedure vital signs reviewed and stable Respiratory status: spontaneous breathing, nonlabored ventilation and respiratory function stable Cardiovascular status: blood pressure returned to baseline and stable Postop Assessment: no signs of nausea or vomiting Anesthetic complications: no    Last Vitals:  Vitals:   09/17/16 1715 09/17/16 1730  BP: 122/73 131/71  Pulse: 75 72  Resp: 12 12  Temp:  36.5 C    Last Pain:  Vitals:   09/17/16 1730  TempSrc:   PainSc: 3    Pain Goal: Patients Stated Pain Goal: 3 (09/17/16 1730)               Lynda Rainwater
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
## 2016-09-17 NOTE — Progress Notes (Signed)
Patient had a headache 4/10 and nausea.  Fentanyl 50 mcg and zofran 4 mg given by IV per Dr Candida Peeling.  Patient states "no pain and nausea much better".

## 2016-09-17 NOTE — Transfer of Care (Signed)
Immediate Anesthesia Transfer of Care Note  Patient: Heather Sawyer  Procedure(s) Performed: Procedure(s): ROBOTIC ASSISTED TOTAL HYSTERECTOMY WITH BILATERAL SALPINGECTOMY, RIGHT OOPHERECTOMY (Bilateral)  Patient Location: PACU  Anesthesia Type:General  Level of Consciousness: sedated  Airway & Oxygen Therapy: Patient Spontanous Breathing and Patient connected to nasal cannula oxygen  Post-op Assessment: Report given to RN and Post -op Vital signs reviewed and stable  Post vital signs: stable  Last Vitals:  Vitals:   09/17/16 1108  BP: 119/74  Pulse: 63  Resp: 16  Temp: (!) 36.4 C    Last Pain:  Vitals:   09/17/16 1200  TempSrc:   PainSc: 0-No pain      Patients Stated Pain Goal: 3 (00/34/96 1164)  Complications: No apparent anesthesia complications

## 2016-09-17 NOTE — Anesthesia Preprocedure Evaluation (Signed)
Anesthesia Evaluation  Patient identified by MRN, date of birth, ID band Patient awake    Reviewed: Allergy & Precautions, NPO status , Patient's Chart, lab work & pertinent test results  History of Anesthesia Complications (+) PONV  Airway Mallampati: II  TM Distance: >3 FB Neck ROM: Full    Dental no notable dental hx.    Pulmonary neg pulmonary ROS,    Pulmonary exam normal breath sounds clear to auscultation       Cardiovascular negative cardio ROS Normal cardiovascular exam Rhythm:Regular Rate:Normal     Neuro/Psych  Headaches, negative neurological ROS  negative psych ROS   GI/Hepatic negative GI ROS, Neg liver ROS,   Endo/Other  negative endocrine ROS  Renal/GU negative Renal ROS  negative genitourinary   Musculoskeletal negative musculoskeletal ROS (+)   Abdominal   Peds negative pediatric ROS (+)  Hematology negative hematology ROS (+)   Anesthesia Other Findings   Reproductive/Obstetrics negative OB ROS                             Anesthesia Physical Anesthesia Plan  ASA: II  Anesthesia Plan: General   Post-op Pain Management:    Induction: Intravenous  PONV Risk Score and Plan: 4 or greater and Ondansetron, Dexamethasone, Midazolam, Scopolamine patch - Pre-op and Treatment may vary due to age or medical condition  Airway Management Planned: Oral ETT  Additional Equipment:   Intra-op Plan:   Post-operative Plan: Extubation in OR  Informed Consent: I have reviewed the patients History and Physical, chart, labs and discussed the procedure including the risks, benefits and alternatives for the proposed anesthesia with the patient or authorized representative who has indicated his/her understanding and acceptance.   Dental advisory given  Plan Discussed with: CRNA  Anesthesia Plan Comments:         Anesthesia Quick Evaluation

## 2016-09-17 NOTE — Op Note (Signed)
NAMEJATZIRY, WECHTER              ACCOUNT NO.:  [id number]  MEDICAL RECORD NO.:  [id number]  LOCATION:  [facility]                          FACILITY:  [facility]  PHYSICIAN:  Lovenia Kim, M.D.DATE OF BIRTH:  [birth year]  DATE OF PROCEDURE: DATE OF DISCHARGE:                              OPERATIVE REPORT   PREOPERATIVE DIAGNOSIS:  Dysmenorrhea, menorrhagia, and pelvic pain.  POSTOPERATIVE DIAGNOSIS:  Severe pelvic endometriosis.  PROCEDURES: 1. Da Vinci assisted total laparoscopic hysterectomy. 2. Right salpingo-oophorectomy. 3. Left salpingectomy. 4. Excision of cul-de-sac endometriosis. 5. Ablation of cul-de-sac and left adnexal endometriosis,. 6. Lysis of adhesions of left ovary to left pelvic sidewall, left     sigmoid colon to left adnexa, and right adnexa to right pelvic     sidewall. 7. McCall culdoplasty.  SURGEON:  Lovenia Kim, M.D.  ASSISTANTMurrell Redden, MD.  ESTIMATED BLOOD LOSS:  Less than 50 mL.  COMPLICATIONS:  None.  DRAINS:  Foley.  COUNTS:  Correct.  SPECIMEN:  Uterus, cervix, right tube and ovary, left tube and peritoneal specimen from right cul-de-sac to Pathology.  DISPOSITION:  The patient to recovery room in good condition.  DESCRIPTION OF PROCEDURE:  After being apprised of the risks of anesthesia, infection, bleeding, injury to surrounding organs, possible need for repair, delayed versus immediate complications to include bowel and bladder injury, and possible need for repair, the patient was brought to the operating room where she was administered general anesthetic without complications.  Prepped and draped in usual sterile fashion.  Foley catheter placed.  Exam under anesthesia reveals an anteflexed uterus.  No adnexal masses.  RUMI retractor placed vaginally without difficulty.  At this time, infraumbilical incision made with a scalpel.  Veress needle placed with opening pressure of -4.  3-1/2 L of CO2 insufflated without difficulty.   Trocar placed atraumatically and pictures taken.  Visualization reveals normal liver, gallbladder bed, normal appendix, normal diaphragmatic area.  In the cul-de-sac, there is partial obliteration of posterior cul-de-sac, some endometrial implants in the anterior and posterior cul-de-sac.  Severe adhesions of the right tuboovarian complex to the right adnexa with inability to differentiate the tubal structures from the ovarian structures.  Fimbria was visualized, tucked medially to its predicted anatomic location.  On the left, the left ovary was also adhesed, but otherwise normal to the left pelvic sidewall.  At this time, there were 2 accessory trocars on the right and 2 on the left that are placed.  The deep Trendelenburg position was established and the robot was docked in the standard fashion.  PK forceps, Endo Shears, and ProGrasp forceps were placed for manipulation and operative intervention.  At this time, the ureter was identified bilaterally along the right adnexa.  The retroperitoneal space was entered just cephalad to the round ligament and the ureter was dissected sharply off the right peritoneal edge.  At this time, due to the significant adhesions and inability to appreciate normal anatomy, the infundibulopelvic ligament was skeletonized on the right and divided using bipolar cautery.  The specimen was completely detached.  The round ligament was divided.  The bladder flap was developed sharply.  The right uterine vessels were skeletonized and the left adnexa  and the tube were undermined and it is lysed along the left mesosalpinx. Retroperitoneal space was entered.  The ureter was identified.  The tuboovarian round ligament was cauterized and divided.  The left round ligament was divided.  The bladder flap was developed sharply.  The left uterine vessels were skeletonized on the left and right uterine vessels were divided.  The bladder was further dissected off the RUMI cup  and the specimen was divided at the cervicovaginal junction without difficulties, retracted in the vagina, and sent for permanent pathology. At this time, irrigation was accomplished.  Ureters were seen peristalsing bilaterally.  Urine was clear.  Vaginal cuff was closed in 2 running imbricating layers of 0 V-Loc suture.  Good hemostasis noted. Second imbricating layer was placed.  McCall culdoplasty suture was placed and suture needle was removed.  Irrigation was accomplished.  All robotic instruments removed under direct visualization, CO2 released, good hemostasis noted.  Ropivacaine solution placed intra-abdominally and the incisions were closed using 0 Vicryl, 4-0 Vicryl, and Dermabond. Vaginal exam reveals the cuff to be well approximated.  Urine was once again clear.  Dilute Marcaine solution was placed in all incisions.  The patient tolerated the procedure well, was awakened, and transferred to recovery in good condition.     Lovenia Kim, M.D.     RJT/MEDQ  D:  09/17/2016  T:  09/17/2016  Job:  [id number]

## 2016-09-18 DIAGNOSIS — N802 Endometriosis of fallopian tube: Secondary | ICD-10-CM | POA: Diagnosis not present

## 2016-09-18 LAB — BASIC METABOLIC PANEL
Anion gap: 3 — ABNORMAL LOW (ref 5–15)
BUN: 9 mg/dL (ref 6–20)
CHLORIDE: 105 mmol/L (ref 101–111)
CO2: 28 mmol/L (ref 22–32)
Calcium: 8.4 mg/dL — ABNORMAL LOW (ref 8.9–10.3)
Creatinine, Ser: 0.88 mg/dL (ref 0.44–1.00)
Glucose, Bld: 135 mg/dL — ABNORMAL HIGH (ref 65–99)
Potassium: 3.7 mmol/L (ref 3.5–5.1)
SODIUM: 136 mmol/L (ref 135–145)

## 2016-09-18 LAB — CBC
HEMATOCRIT: 32.5 % — AB (ref 36.0–46.0)
HEMOGLOBIN: 10.7 g/dL — AB (ref 12.0–15.0)
MCH: 30.6 pg (ref 26.0–34.0)
MCHC: 32.9 g/dL (ref 30.0–36.0)
MCV: 92.9 fL (ref 78.0–100.0)
Platelets: 157 10*3/uL (ref 150–400)
RBC: 3.5 MIL/uL — ABNORMAL LOW (ref 3.87–5.11)
RDW: 14.3 % (ref 11.5–15.5)
WBC: 7.7 10*3/uL (ref 4.0–10.5)

## 2016-09-18 MED ORDER — TRAMADOL HCL 50 MG PO TABS
50.0000 mg | ORAL_TABLET | Freq: Four times a day (QID) | ORAL | 0 refills | Status: AC | PRN
Start: 2016-09-18 — End: ?

## 2016-09-18 MED ORDER — OXYCODONE-ACETAMINOPHEN 5-325 MG PO TABS
1.0000 | ORAL_TABLET | ORAL | 0 refills | Status: AC | PRN
Start: 2016-09-18 — End: ?

## 2016-09-18 MED FILL — traMADol HCL 50 MG TABS: 50 | 3 days supply | Qty: 30 | Fill #0

## 2016-09-18 MED FILL — OXYCODONE-ACETAMINOPHEN 5-3: 5-325 | 3 days supply | Qty: 30 | Fill #0

## 2016-09-18 NOTE — Progress Notes (Signed)
1 Day Post-Op Procedure(s) (LRB): ROBOTIC ASSISTED TOTAL HYSTERECTOMY WITH BILATERAL SALPINGECTOMY, RIGHT OOPHERECTOMY (Bilateral)  Subjective: Patient reports nausea, incisional pain, tolerating PO, + flatus and no problems voiding.    Objective: BP (!) 100/52 (BP Location: Left Arm)   Pulse 86   Temp 98.8 F (37.1 C) (Oral)   Resp 15   LMP 09/08/2016 (Approximate)   SpO2 99%   I have reviewed patient's vital signs, intake and output and labs. CBC    Component Value Date/Time   WBC 7.7 09/18/2016 0616   RBC 3.50 (L) 09/18/2016 0616   HGB 10.7 (L) 09/18/2016 0616   HCT 32.5 (L) 09/18/2016 0616   PLT 157 09/18/2016 0616   MCV 92.9 09/18/2016 0616   MCH 30.6 09/18/2016 0616   MCHC 32.9 09/18/2016 0616   RDW 14.3 09/18/2016 0616     General: alert, cooperative and appears stated age Resp: clear to auscultation bilaterally Cardio: regular rate and rhythm, S1, S2 normal, no murmur, click, rub or gallop GI: soft, non-tender; bowel sounds normal; no masses,  no organomegaly and incision: clean, dry and intact Extremities: extremities normal, atraumatic, no cyanosis or edema Vaginal Bleeding: minimal  Assessment: s/p Procedure(s): ROBOTIC ASSISTED TOTAL HYSTERECTOMY WITH BILATERAL SALPINGECTOMY, RIGHT OOPHERECTOMY (Bilateral): stable, progressing well and tolerating diet  Plan: Advance diet Encourage ambulation Advance to PO medication Discontinue IV fluids Discharge home  LOS: 0 days    Heather Sawyer J 09/18/2016, 6:34 AM

## 2016-09-18 NOTE — Progress Notes (Signed)
Attempted to get patient to ambulate again pt got very nauseated and dizzy when extremities dangled at bedside. We will try and ambulate pt in the next hour.

## 2016-09-21 ENCOUNTER — Encounter (HOSPITAL_COMMUNITY): Payer: Self-pay | Admitting: Obstetrics and Gynecology

## 2016-10-02 MED FILL — IBUPROFEN 800 MG TAB: 800 | 20 days supply | Qty: 60 | Fill #0

## 2016-12-19 DIAGNOSIS — H5010 Unspecified exotropia: Secondary | ICD-10-CM | POA: Diagnosis not present

## 2016-12-19 DIAGNOSIS — H5203 Hypermetropia, bilateral: Secondary | ICD-10-CM | POA: Diagnosis not present

## 2017-04-12 MED FILL — HYDROCORTISONE-PRAMOXINE CR: 2.5-1 | 10 days supply | Qty: 28 | Fill #0

## 2017-07-09 MED FILL — IBUPROFEN 800 MG TAB: 800 | 20 days supply | Qty: 60 | Fill #1

## 2017-11-26 ENCOUNTER — Telehealth: Payer: No Typology Code available for payment source | Admitting: Physician Assistant

## 2017-11-26 DIAGNOSIS — T753XXA Motion sickness, initial encounter: Secondary | ICD-10-CM

## 2017-11-26 MED ORDER — SCOPOLAMINE 1 MG/3DAYS TD PT72: 1.0000 | MEDICATED_PATCH | TRANSDERMAL | 12 refills | Status: AC

## 2017-11-26 MED FILL — SCOPOLAMINE 1 MG/3DAYS PT72: 1 | 30 days supply | Qty: 10 | Fill #0

## 2017-11-26 NOTE — Progress Notes (Signed)
E Visit for Motion Sickness  We are sorry that you are not feeling well. Here is how we plan to help!  Based on what you have shared with me it looks like you have symptoms of motion sickness.  I have prescribed a medication that will help prevent or alleviate your symptoms:  Scopolamine Transdermal 1 mg patch behind ear at least 4 hours prior to travel (preferably 12 hours)   Prevention:  You might feel better if you keep your eyes focused on outside while you are in motion. For example, if you are in a car, sit in the front and look in the direction you are moving; if you are on a boat, stay on the deck and look to the horizon. This helps make what you see match the movement you are feeling, and so you are less likely to feel sick.  You should also avoid reading, watching a movie, texting or reading messages, or looking at things close to you inside the vehicle you are riding in.  . Use the seat head rest. Lean your head against the back of the seat or head rest when traveling in vehicles with seats to minimize head movements.  . On a ship: When making your reservations, choose a cabin in the middle of the ship and near the waterline. When on board, go up on deck and focus on the horizon.  . In an airplane: Request a window seat and look out the window. A seat over the front edge of the wing is the most preferable spot (the degree of motion is the lowest here). Direct the air vent to blow cool air on your face.  . On a train: Always face forward and sit near a window.  . In a vehicle: Sit in the front seat; if you are the passenger, look at the scenery in the distance. For some people, driving the vehicle (rather than being a passenger) is an instant remedy.  . Avoid others who have become nauseous with motion sickness. Seeing and smelling others who have motion sickness may cause you to become sick.  GET HELP RIGHT AWAY IF:   Your symptoms do not improve or worsen within 2 days  after treatment.   You cannot keep down fluids after trying the medication.   Other associated symptoms such as severe headache, visual field changes, fever, or intractable nausea and vomiting.  MAKE SURE YOU:   Understand these instructions.  Will watch your condition.  Will get help right away if you are not doing well or get worse.  Thank you for choosing an e-visit.  Your e-visit answers were reviewed by a board certified advanced clinical practitioner to complete your personal care plan. Depending upon the condition, your plan could have included both over the counter or prescription medications.  Please review your pharmacy choice. Be sure that the pharmacy you have chosen is open so that you can pick up your prescription now.  If there is a problem you may message your provider in [place] to have the prescription routed to another pharmacy.  Your safety is important to [place]. If you have drug allergies check your prescription carefully.   For the next 24 hours, you can use MyChart to ask questions about today's visit, request a non-urgent call back, or ask for a work or school excuse from your e-visit provider.  You will get an e-mail in the next two days asking about your experience. I hope that your e-visit has been  valuable and will speed your recovery.   References or for more information: [email or web address] [email or web address]

## 2017-12-06 MED FILL — tiZANidine HCL 4 MG TABS: 4 | 10 days supply | Qty: 15 | Fill #0

## 2018-01-08 MED FILL — IBUPROFEN 800 MG TAB: 800 | 20 days supply | Qty: 60 | Fill #0

## 2018-05-10 MED FILL — IBUPROFEN 800 MG TAB: 800 | 20 days supply | Qty: 60 | Fill #1

## 2019-08-04 ENCOUNTER — Telehealth: Payer: No Typology Code available for payment source | Admitting: Family

## 2019-08-04 DIAGNOSIS — N390 Urinary tract infection, site not specified: Secondary | ICD-10-CM | POA: Diagnosis not present

## 2019-08-04 DIAGNOSIS — A499 Bacterial infection, unspecified: Secondary | ICD-10-CM | POA: Diagnosis not present

## 2019-08-04 MED ORDER — NITROFURANTOIN MONOHYD MACRO 100 MG PO CAPS: 100.0000 mg | ORAL_CAPSULE | Freq: Two times a day (BID) | ORAL | 0 refills | Status: AC

## 2019-08-04 MED FILL — NITROFURANTOIN MONO-MCR 100: 100 | 5 days supply | Qty: 10 | Fill #0

## 2019-08-04 NOTE — Progress Notes (Signed)

## 2019-11-28 ENCOUNTER — Other Ambulatory Visit: Payer: No Typology Code available for payment source

## 2019-11-28 DIAGNOSIS — Z20822 Contact with and (suspected) exposure to covid-19: Secondary | ICD-10-CM

## 2019-11-29 LAB — SARS-COV-2, NAA 2 DAY TAT

## 2019-11-29 LAB — NOVEL CORONAVIRUS, NAA: SARS-CoV-2, NAA: NOT DETECTED

## 2019-12-02 ENCOUNTER — Other Ambulatory Visit: Payer: No Typology Code available for payment source

## 2019-12-02 ENCOUNTER — Other Ambulatory Visit: Payer: Self-pay

## 2019-12-02 DIAGNOSIS — Z20822 Contact with and (suspected) exposure to covid-19: Secondary | ICD-10-CM

## 2019-12-04 LAB — SARS-COV-2, NAA 2 DAY TAT

## 2019-12-04 LAB — NOVEL CORONAVIRUS, NAA: SARS-CoV-2, NAA: NOT DETECTED

## 2020-10-06 ENCOUNTER — Other Ambulatory Visit (HOSPITAL_BASED_OUTPATIENT_CLINIC_OR_DEPARTMENT_OTHER): Payer: Self-pay

## 2020-11-29 ENCOUNTER — Other Ambulatory Visit (HOSPITAL_BASED_OUTPATIENT_CLINIC_OR_DEPARTMENT_OTHER): Payer: Self-pay

## 2020-11-29 MED ORDER — COVID-19 AT-HOME TEST VI KIT
PACK | 0 refills | Status: AC
  Filled 2020-11-29: qty 2, 2d supply, fill #0

## 2021-06-19 ENCOUNTER — Other Ambulatory Visit (HOSPITAL_COMMUNITY): Payer: Self-pay | Admitting: Family Medicine

## 2021-06-19 DIAGNOSIS — E7800 Pure hypercholesterolemia, unspecified: Secondary | ICD-10-CM

## 2021-06-27 ENCOUNTER — Encounter (HOSPITAL_BASED_OUTPATIENT_CLINIC_OR_DEPARTMENT_OTHER): Payer: Self-pay

## 2021-06-27 ENCOUNTER — Ambulatory Visit (HOSPITAL_BASED_OUTPATIENT_CLINIC_OR_DEPARTMENT_OTHER)
Admission: RE | Admit: 2021-06-27 | Discharge: 2021-06-27 | Disposition: A | Discharge status: Home / Self Care | Payer: No Typology Code available for payment source | Source: Ambulatory Visit | Attending: Family Medicine | Admitting: Family Medicine

## 2021-06-27 DIAGNOSIS — E7800 Pure hypercholesterolemia, unspecified: Secondary | ICD-10-CM

## 2021-07-13 ENCOUNTER — Other Ambulatory Visit (HOSPITAL_COMMUNITY): Payer: Self-pay

## 2021-10-09 ENCOUNTER — Other Ambulatory Visit (HOSPITAL_BASED_OUTPATIENT_CLINIC_OR_DEPARTMENT_OTHER): Payer: Self-pay

## 2022-01-31 ENCOUNTER — Other Ambulatory Visit (HOSPITAL_BASED_OUTPATIENT_CLINIC_OR_DEPARTMENT_OTHER): Payer: Self-pay

## 2022-01-31 ENCOUNTER — Other Ambulatory Visit (HOSPITAL_COMMUNITY): Payer: Self-pay

## 2022-01-31 MED ORDER — SCOPOLAMINE 1 MG/3DAYS TD PT72
MEDICATED_PATCH | TRANSDERMAL | 0 refills | Status: AC
  Filled 2022-01-31 – 2022-02-09 (×2): qty 8, 24d supply, fill #0

## 2022-02-01 ENCOUNTER — Other Ambulatory Visit (HOSPITAL_COMMUNITY): Payer: Self-pay

## 2022-02-08 ENCOUNTER — Other Ambulatory Visit (HOSPITAL_COMMUNITY): Payer: Self-pay

## 2022-02-09 ENCOUNTER — Other Ambulatory Visit: Payer: Self-pay

## 2022-02-09 ENCOUNTER — Other Ambulatory Visit (HOSPITAL_COMMUNITY): Payer: Self-pay

## 2022-02-10 ENCOUNTER — Other Ambulatory Visit (HOSPITAL_COMMUNITY): Payer: Self-pay

## 2022-10-21 IMAGING — CT CT CARDIAC CORONARY ARTERY CALCIUM SCORE
3 series · 14 of 20 positions shown, 16 images · non-contrast
Comparison: None Available.
COMPARISON: None Available.

Addendum:
CLINICAL DATA: This over-read does not include interpretation of
cardiac or coronary anatomy or pathology. The coronary calcium score
interpretation by the cardiologist is attached.
CLINICAL DATA: Cardiovascular Disease Risk stratification

EXAM:
Coronary Calcium Score
TECHNIQUE: A gated, non-contrast computed tomography scan of the heart was
performed using 3mm slice thickness. Axial images were analyzed on a
dedicated workstation. Calcium scoring of the coronary arteries was
performed using the Agatston method.

[Series 2: ax lung · axial · 0.66mm/px · z∈[+1130,+1242]mm · 5 of 86 slices shown]
[im 15/86  lung]
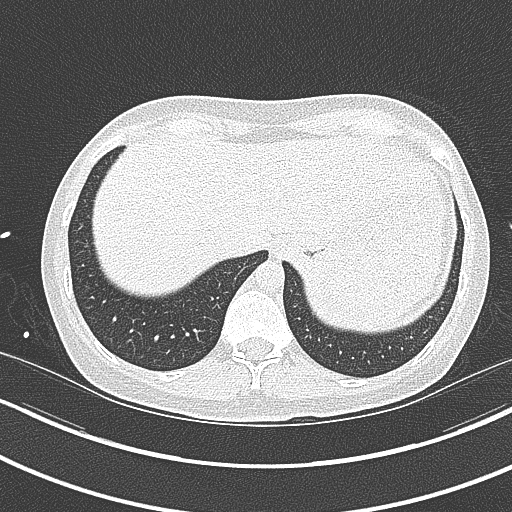
[im 29/86  lung]
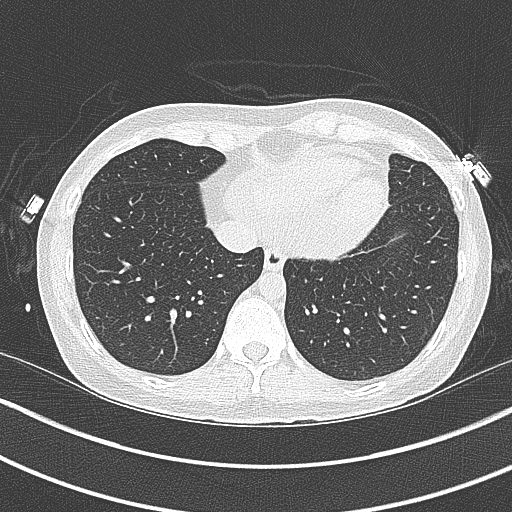
[im 43/86  lung]
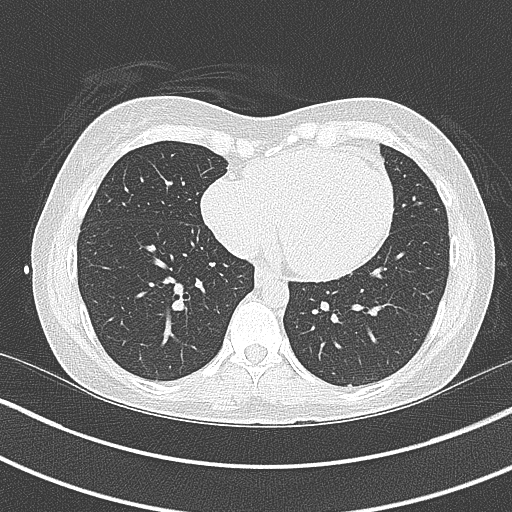
[im 57/86  lung]
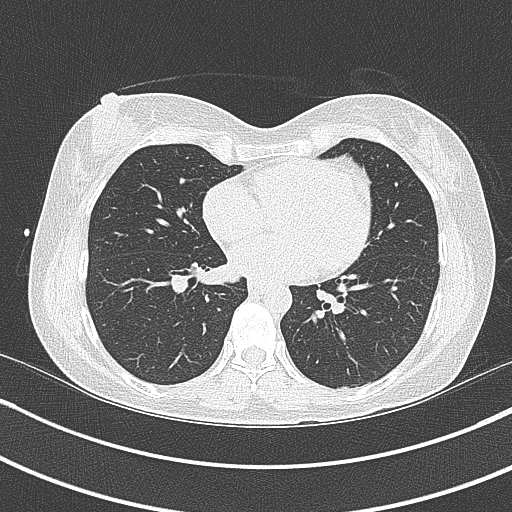
[im 71/86  lung]
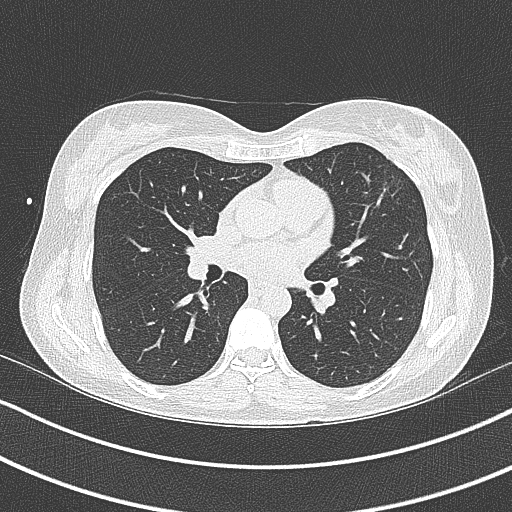

[Series 3: cascseq 3.0 sa36 70% (id) · axial · 0.39mm/px · z∈[+1145,+1229]mm · 3 of 57 slices shown]
[im 15/57  vessel]
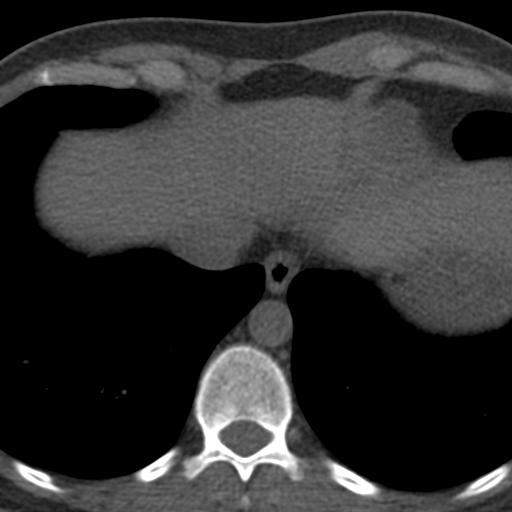
[im 29/57  vessel]
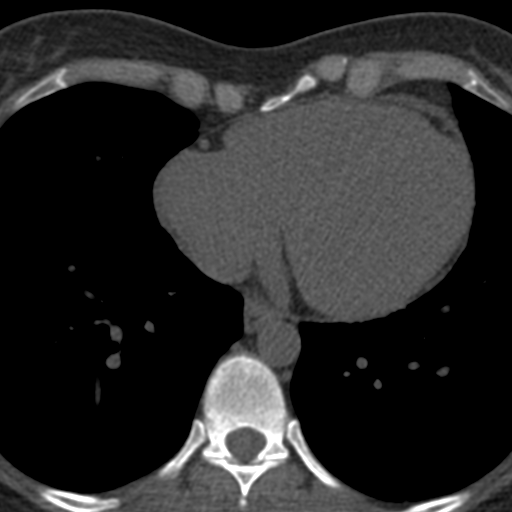
[im 43/57  vessel]
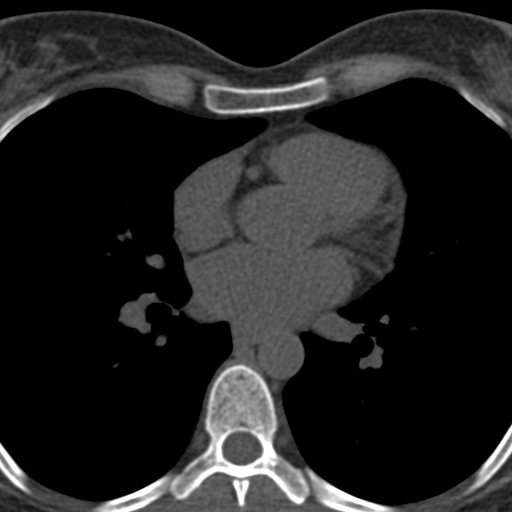

[Series 4: ax st · axial · 0.66mm/px · z∈[+1126,+1246]mm · 6 of 86 slices shown, 8 images]
[im 13/86  vessel]
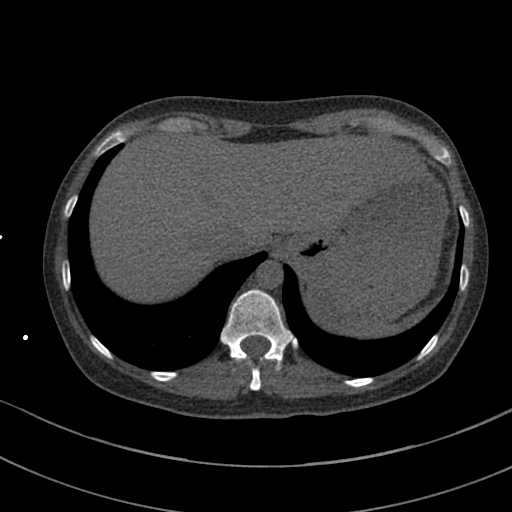
[im 13/86  lung]
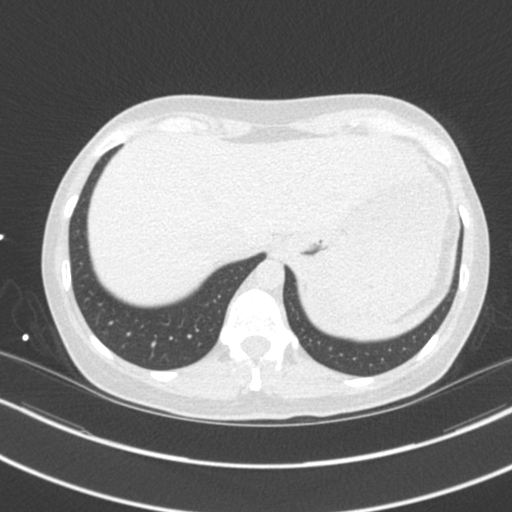
[im 25/86  vessel]
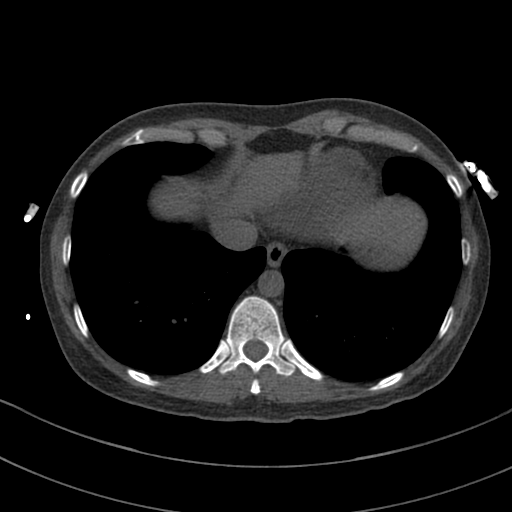
[im 37/86  vessel]
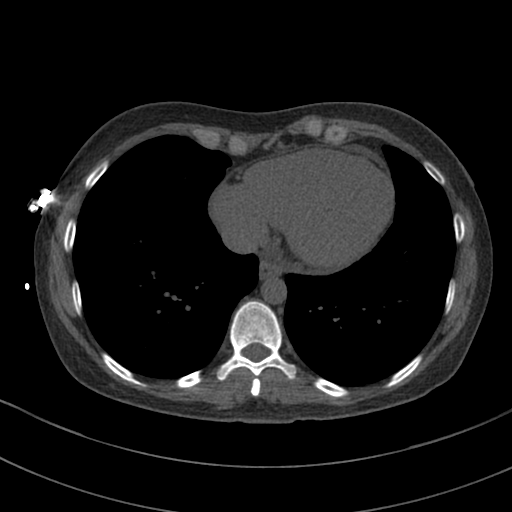
[im 49/86  vessel]
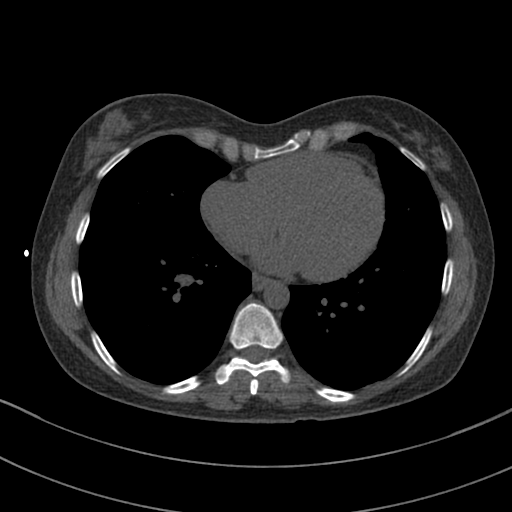
[im 61/86  vessel]
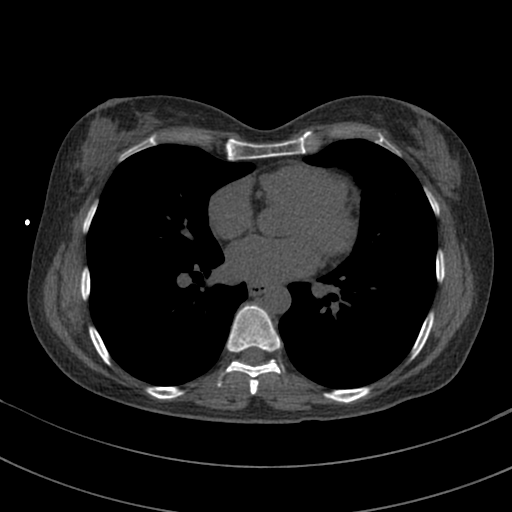
[im 61/86  lung]
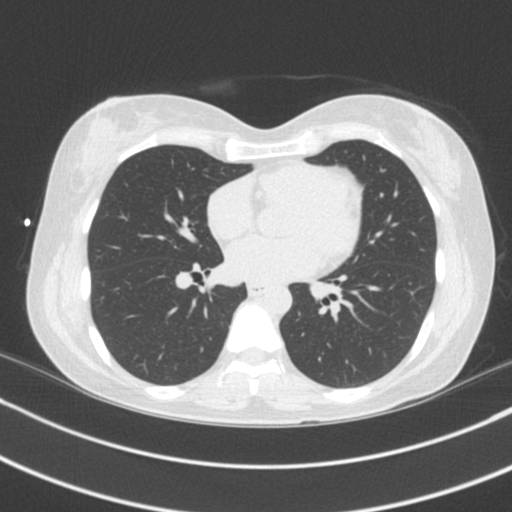
[im 73/86  vessel]
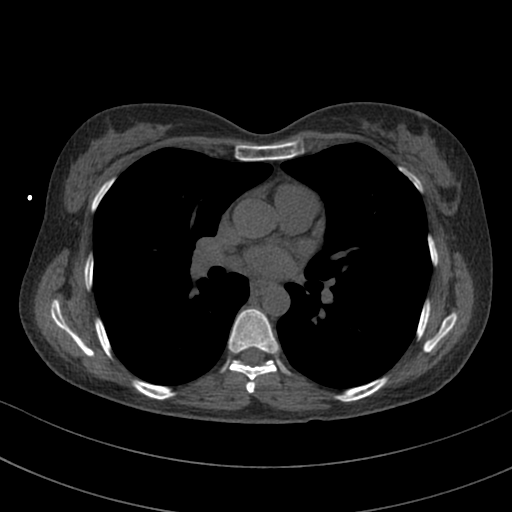

[14 of 20 positions shown; findings below may reference images not displayed]

FINDINGS: Within the visualized portions of the thorax there are no suspicious
appearing pulmonary nodules or masses, there is no acute
consolidative airspace disease, no pleural effusions, no
pneumothorax and no lymphadenopathy. Visualized portions of the
upper abdomen are unremarkable. There are no aggressive appearing
lytic or blastic lesions noted in the visualized portions of the
skeleton.
IMPRESSION: 1. No significant incidental noncardiac findings are noted.
FINDINGS: Coronary Calcium Score:

Left main: 0

Left anterior descending artery: 0

Left circumflex artery: 0

Right coronary artery: 0

Total: 0

Percentile: 0

Pericardium: Normal.

Ascending Aorta: Normal caliber.

Non-cardiac: See separate report from [REDACTED].
IMPRESSION: Coronary calcium score of 0. This was 0 percentile for age-, race-,
and sex-matched controls.



If CAC=0, it is reasonable to withhold statin therapy and reassess
in 5 to 10 years, as long as higher risk conditions are absent
(diabetes mellitus, family history of premature CHD in first degree
relatives (males <55 years; females <65 years), cigarette smoking,
or LDL >=190 mg/dL).

If CAC is 1 to 99, it is reasonable to initiate statin therapy for
patients >=55 years of age.

If CAC is >=100 or >=75th percentile, it is reasonable to initiate
statin therapy at any age.

Cardiology referral should be considered for patients with CAC
scores >=400 or >=75th percentile.

*5709 AHA/ACC/AACVPR/AAPA/ABC/KELOONG/HANH/RUST/Arzate/BERTSCH/NIVIRUS/AJA
Guideline on the Management of Blood Cholesterol: A Report of the
American College of Cardiology/American Heart Association Task Force
on Clinical Practice Guidelines. J Am Coll Cardiol.
3882;73(24):3107-3637.

*** End of Addendum ***
FINDINGS: Within the visualized portions of the thorax there are no suspicious
appearing pulmonary nodules or masses, there is no acute
consolidative airspace disease, no pleural effusions, no
pneumothorax and no lymphadenopathy. Visualized portions of the
upper abdomen are unremarkable. There are no aggressive appearing
lytic or blastic lesions noted in the visualized portions of the
skeleton.
IMPRESSION: 1. No significant incidental noncardiac findings are noted.

## 2023-12-14 ENCOUNTER — Other Ambulatory Visit (HOSPITAL_BASED_OUTPATIENT_CLINIC_OR_DEPARTMENT_OTHER): Payer: Self-pay

## 2024-02-14 ENCOUNTER — Telehealth: Payer: Self-pay

## 2024-02-14 NOTE — Telephone Encounter (Signed)
 Patient contacted the office to schedule a new patient appointment. Please advise.

## 2024-04-21 ENCOUNTER — Ambulatory Visit: Admitting: Physician Assistant
